# Patient Record
Sex: Male | Born: 1970 | Race: White | Hispanic: No | Marital: Married | Smoking: Current every day smoker
Health system: Southern US, Community
[De-identification: ages and names within clinical notes are randomized; demographics above are authoritative.]

## PROBLEM LIST (undated history)

## (undated) DIAGNOSIS — J984 Other disorders of lung: Secondary | ICD-10-CM

## (undated) DIAGNOSIS — I10 Essential (primary) hypertension: Secondary | ICD-10-CM

## (undated) DIAGNOSIS — J189 Pneumonia, unspecified organism: Secondary | ICD-10-CM

## (undated) DIAGNOSIS — E785 Hyperlipidemia, unspecified: Secondary | ICD-10-CM

## (undated) DIAGNOSIS — M069 Rheumatoid arthritis, unspecified: Secondary | ICD-10-CM

## (undated) DIAGNOSIS — G8929 Other chronic pain: Secondary | ICD-10-CM

## (undated) HISTORY — PX: LUMBAR NERVE STIMLATOR INSERTION: SHX1983

## (undated) HISTORY — DX: Pneumonia, unspecified organism: J18.9

## (undated) HISTORY — DX: Rheumatoid arthritis, unspecified: M06.9

## (undated) HISTORY — DX: Essential (primary) hypertension: I10

## (undated) HISTORY — DX: Hyperlipidemia, unspecified: E78.5

## (undated) HISTORY — DX: Other disorders of lung: J98.4

## (undated) HISTORY — PX: LAMINECTOMY: SHX219

## (undated) HISTORY — DX: Other chronic pain: G89.29

---

## 2013-12-22 ENCOUNTER — Encounter: Payer: Self-pay | Admitting: Critical Care Medicine

## 2013-12-25 ENCOUNTER — Institutional Professional Consult (permissible substitution): Payer: Self-pay | Admitting: Critical Care Medicine

## 2013-12-27 ENCOUNTER — Ambulatory Visit (INDEPENDENT_AMBULATORY_CARE_PROVIDER_SITE_OTHER): Payer: Medicare Other | Admitting: Critical Care Medicine

## 2013-12-27 ENCOUNTER — Ambulatory Visit (INDEPENDENT_AMBULATORY_CARE_PROVIDER_SITE_OTHER)
Admission: RE | Admit: 2013-12-27 | Discharge: 2013-12-27 | Disposition: A | Discharge status: Home / Self Care | Payer: Medicare Other | Source: Ambulatory Visit | Attending: Critical Care Medicine | Admitting: Critical Care Medicine

## 2013-12-27 ENCOUNTER — Encounter: Payer: Self-pay | Admitting: Critical Care Medicine

## 2013-12-27 VITALS — BP 116/84 | HR 107 | Ht 66.0 in | Wt 162.0 lb

## 2013-12-27 DIAGNOSIS — M069 Rheumatoid arthritis, unspecified: Secondary | ICD-10-CM

## 2013-12-27 DIAGNOSIS — E785 Hyperlipidemia, unspecified: Secondary | ICD-10-CM | POA: Insufficient documentation

## 2013-12-27 DIAGNOSIS — G8929 Other chronic pain: Secondary | ICD-10-CM

## 2013-12-27 DIAGNOSIS — J984 Other disorders of lung: Secondary | ICD-10-CM

## 2013-12-27 DIAGNOSIS — J9610 Chronic respiratory failure, unspecified whether with hypoxia or hypercapnia: Secondary | ICD-10-CM

## 2013-12-27 DIAGNOSIS — J438 Other emphysema: Secondary | ICD-10-CM

## 2013-12-27 DIAGNOSIS — J439 Emphysema, unspecified: Secondary | ICD-10-CM | POA: Insufficient documentation

## 2013-12-27 DIAGNOSIS — I10 Essential (primary) hypertension: Secondary | ICD-10-CM | POA: Insufficient documentation

## 2013-12-27 DIAGNOSIS — F17200 Nicotine dependence, unspecified, uncomplicated: Secondary | ICD-10-CM

## 2013-12-27 DIAGNOSIS — J9612 Chronic respiratory failure with hypercapnia: Secondary | ICD-10-CM | POA: Insufficient documentation

## 2013-12-27 MED ORDER — BUPROPION HCL ER (SR) 150 MG PO TB12: 150.0000 mg | ORAL_TABLET | Freq: Two times a day (BID) | ORAL | Status: DC

## 2013-12-27 MED ORDER — TIOTROPIUM BROMIDE MONOHYDRATE 2.5 MCG/ACT IN AERS: INHALATION_SPRAY | RESPIRATORY_TRACT | Status: DC

## 2013-12-27 NOTE — Patient Instructions (Signed)
Chest xray today Start Spiriva two puff daily Stay on oxygen, a face mask was ordered Obtain an ABG today at [facility] Cardiopulmonary Dept Stop smoking, use Wellbutrin 150mg  TWICE daily Return 1 month

## 2013-12-27 NOTE — Progress Notes (Signed)
Subjective:    Patient ID: Willie Cruz, male    DOB: Mar 16, 1971, 43 y.o.   MRN: [id number]  HPI Comments: Dx Copd 04/2013.  Hx of smoking in past. Pt was found AMS, in home.  Pt went to hosp in for one week. Started on oxygen and dx. Then another episode 07/13/13>>another resp failure event. Pt on vent for 4days.  ?oversedation? Pulled out nasal cannula.  Hose got kinked in the car and pt blue then in another hosp in [place]. Two more episodes since that time when fall asleep.    Pt still smokes:  1Pack q 2-3 .  Only tried cold [place]. Tried patches, welbutrin.   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 3L 24/7 .  No pfts yet  Shortness of Breath This is a chronic problem. The current episode started more than 1 month ago. The problem occurs constantly. The problem has been gradually worsening. Associated symptoms include orthopnea, a rash and vomiting. Pertinent negatives include no abdominal pain, chest pain, claudication, coryza, ear pain, fever, headaches, hemoptysis, leg pain, leg swelling, neck pain, PND, rhinorrhea, sore throat, sputum production, swollen glands, syncope or wheezing. The symptoms are aggravated by lying flat and any activity (occ heartburn). Associated symptoms comments: co2 elevated.  . Risk factors include smoking. He has tried nothing for the symptoms. His past medical history is significant for COPD and pneumonia. There is no history of asthma, bronchiolitis, DVT or PE.   Past Medical History  Diagnosis Date  . Rheumatoid arthritis(714.0)   . Hyperlipidemia   . Pneumonia   . Chronic lung disease   . Chronic pain   . Hypertension      Family History  Problem Relation Age of Onset  . Pancreatic cancer Father   . Hypertension Father   . CAD Mother   . Hypertension Brother   . Breast cancer Maternal Aunt      History   Social History  . Marital Status: Married    Spouse Name: N/A    Number of Children: N/A  . Years of Education: N/A   Occupational  History  . Not on file.   Social History Main Topics  . Smoking status: Current Every Day Smoker -- 0.40 packs/day    Types: Cigarettes    Start date: 07/20/1988  . Smokeless tobacco: Never Used  . Alcohol Use: No     Comment: quit in 2000  . Drug Use: No  . Sexual Activity: Not on file   Other Topics Concern  . Not on file   Social History Narrative  . No narrative on file     No Known Allergies   Outpatient Prescriptions Prior to Visit  Medication Sig Dispense Refill  . diazepam (VALIUM) 10 MG tablet Take 10 mg by mouth at bedtime.      . fentaNYL (DURAGESIC - DOSED MCG/HR) 100 MCG/HR Place 100 mcg onto the skin every other day.      . oxyCODONE-acetaminophen (PERCOCET) 10-325 MG per tablet Take 1 tablet by mouth 4 (four) times daily.      . Potassium Chloride ER 20 MEQ TBCR Take by mouth daily.      . pregabalin (LYRICA) 100 MG capsule Take 100 mg by mouth 3 (three) times daily.      . folic acid (FOLVITE) 1 MG tablet ON HOLD      . hydroxychloroquine (PLAQUENIL) 200 MG tablet ON HOLD      . Vitamin D, Ergocalciferol, (DRISDOL) 50000 UNITS CAPS capsule  ON HOLD      . promethazine (PHENERGAN) 25 MG suppository Place 25 mg rectally every 6 (six) hours as needed for nausea or vomiting.       No facility-administered medications prior to visit.       Review of Systems  Constitutional: Positive for fatigue. Negative for fever, chills, diaphoresis, activity change, appetite change and unexpected weight change.  HENT: Positive for dental problem and trouble swallowing. Negative for congestion, ear discharge, ear pain, facial swelling, hearing loss, mouth sores, nosebleeds, postnasal drip, rhinorrhea, sinus pressure, sneezing, sore throat, tinnitus and voice change.   Eyes: Negative for photophobia, discharge, itching and visual disturbance.  Respiratory: Positive for shortness of breath. Negative for apnea, cough, hemoptysis, sputum production, choking, chest tightness,  wheezing and stridor.   Cardiovascular: Positive for orthopnea. Negative for chest pain, palpitations, claudication, leg swelling, syncope and PND.  Gastrointestinal: Positive for nausea, vomiting and abdominal distention. Negative for abdominal pain, constipation and blood in stool.  Genitourinary: Negative for dysuria, urgency, frequency, hematuria, flank pain, decreased urine volume and difficulty urinating.  Musculoskeletal: Positive for back pain, gait problem and joint swelling. Negative for arthralgias, myalgias, neck pain and neck stiffness.  Skin: Positive for color change, pallor and rash.  Neurological: Positive for dizziness, syncope, weakness and light-headedness. Negative for tremors, seizures, speech difficulty, numbness and headaches.  Hematological: Negative for adenopathy. Does not bruise/bleed easily.  Psychiatric/Behavioral: Positive for confusion and agitation. Negative for sleep disturbance. The patient is nervous/anxious.        Objective:   Physical Exam Filed Vitals:   12/27/13 1119  BP: 116/84  Pulse: 107  Height: 5\' 6"  (1.676 m)  Weight: 162 lb (73.483 kg)  SpO2: 97%    Gen: Pleasant, well-nourished, in no distress,  normal affect  ENT: No lesions,  mouth clear,  oropharynx clear, no postnasal drip  Neck: No JVD, no TMG, no carotid bruits  Lungs: No use of accessory muscles, no dullness to percussion, distant BS , exp wheeze   Cardiovascular: RRR, heart sounds normal, no murmur or gallops, no peripheral edema  Abdomen: soft and NT, no HSM,  BS normal  Musculoskeletal: No deformities, no cyanosis or clubbing  Neuro: alert, non focal  Skin: Warm, no lesions or rashes  Dg Chest 2 View  12/27/2013   CLINICAL DATA:  Shortness of breath and cough.  Follow-up pneumonia.  EXAM: CHEST  2 VIEW  COMPARISON:  None.  FINDINGS: Trachea is midline. Heart size normal. Biapical pleural parenchymal scarring. Lungs are somewhat low in volume. Difficult to exclude  mild bronchiectasis in the right upper lobe. Lungs are otherwise clear. No pleural fluid.  IMPRESSION: 1. No acute findings. 2. Possible mild bronchiectasis in the right upper lobe. If further evaluation is desired, CT chest without contrast could be performed.   Electronically Signed   By: 02/26/2014 M.D.   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 diazepam (VALIUM) 10 MG tablet Take 10 mg by mouth at  bedtime.  . fentaNYL (DURAGESIC - DOSED MCG/HR) 100 MCG/HR Place 100 mcg onto the skin every other day.  . oxyCODONE-acetaminophen (PERCOCET) 10-325 MG per tablet Take 1 tablet by mouth 4 (four) times daily.  . Potassium Chloride ER 20 MEQ TBCR Take by mouth daily.  . pregabalin (LYRICA) 100 MG capsule Take 100 mg by mouth 3 (three) times daily.  . promethazine (PHENERGAN) 25 MG tablet Take 25 mg by mouth every 4 (four) hours as needed for nausea or vomiting.  Marland Kitchen. buPROPion (WELLBUTRIN SR) 150 MG 12 hr tablet Take 1 tablet (150 mg total) by mouth 2 (two) times daily.  . folic acid (FOLVITE) 1 MG tablet ON HOLD  . hydroxychloroquine (PLAQUENIL) 200 MG tablet ON HOLD  . methotrexate (RHEUMATREX) 2.5 MG tablet ON HOLD  . Tiotropium Bromide Monohydrate (SPIRIVA RESPIMAT) 2.5 MCG/ACT AERS Two puff daily  . Vitamin D, Ergocalciferol, (DRISDOL) 50000 UNITS CAPS capsule ON HOLD  . [DISCONTINUED] promethazine (PHENERGAN) 25 MG suppository Place 25 mg rectally every 6 (six) hours as needed for  nausea or vomiting.

## 2013-12-28 NOTE — Assessment & Plan Note (Signed)
Copd with chronic hypercarbic resp failure Plan Start Spiriva two puff daily Stay on oxygen, a face mask was ordered Obtain an ABG today at [facility] Cardiopulmonary Dept Stop smoking, use Wellbutrin 150mg  TWICE daily Return 1 month

## 2013-12-29 ENCOUNTER — Ambulatory Visit (HOSPITAL_COMMUNITY)
Admission: RE | Admit: 2013-12-29 | Discharge: 2013-12-29 | Disposition: A | Discharge status: Home / Self Care | Payer: Medicare Other | Source: Ambulatory Visit | Attending: Critical Care Medicine | Admitting: Critical Care Medicine

## 2013-12-29 DIAGNOSIS — I10 Essential (primary) hypertension: Secondary | ICD-10-CM | POA: Insufficient documentation

## 2013-12-29 DIAGNOSIS — E785 Hyperlipidemia, unspecified: Secondary | ICD-10-CM | POA: Insufficient documentation

## 2013-12-29 DIAGNOSIS — F17200 Nicotine dependence, unspecified, uncomplicated: Secondary | ICD-10-CM | POA: Insufficient documentation

## 2013-12-29 DIAGNOSIS — M069 Rheumatoid arthritis, unspecified: Secondary | ICD-10-CM | POA: Insufficient documentation

## 2013-12-29 DIAGNOSIS — J438 Other emphysema: Secondary | ICD-10-CM | POA: Insufficient documentation

## 2013-12-29 LAB — BLOOD GAS, ARTERIAL
Acid-Base Excess: 8.4 mmol/L — ABNORMAL HIGH (ref 0.0–2.0)
Bicarbonate: 34.6 mEq/L — ABNORMAL HIGH (ref 20.0–24.0)
O2 Content: 3 L/min
O2 SAT: 97.6 %
Patient temperature: 98.6
TCO2: 30.7 mmol/L (ref 0–100)
pCO2 arterial: 56.3 mmHg — ABNORMAL HIGH (ref 35.0–45.0)
pH, Arterial: 7.405 (ref 7.350–7.450)
pO2, Arterial: 91.5 mmHg (ref 80.0–100.0)

## 2013-12-29 NOTE — Progress Notes (Signed)
Quick Note:  Called, spoke with pt. Informed her of cxr results per Dr. Delford Field. He verbalized understanding and voiced no further questions or concerns at this time. ______

## 2013-12-29 NOTE — Progress Notes (Signed)
Quick Note:  Called, spoke with pt. Informed him of ABG results and recs per Dr. Delford Field. He verbalized understanding and voiced no further questions or concerns at this time. ______

## 2014-01-01 ENCOUNTER — Telehealth: Payer: Self-pay | Admitting: Critical Care Medicine

## 2014-01-01 NOTE — Telephone Encounter (Signed)
LM x 1 for [name] to return call

## 2014-01-02 NOTE — Telephone Encounter (Signed)
[facility] for [employer]

## 2014-01-03 NOTE — Telephone Encounter (Signed)
lmtcb for [name].  

## 2014-01-04 NOTE — Telephone Encounter (Signed)
LMTCB for Willie Cruz

## 2014-01-31 ENCOUNTER — Ambulatory Visit: Payer: Medicare Other | Admitting: Critical Care Medicine

## 2014-06-04 ENCOUNTER — Ambulatory Visit (INDEPENDENT_AMBULATORY_CARE_PROVIDER_SITE_OTHER): Payer: Medicare Other | Admitting: Critical Care Medicine

## 2014-06-04 ENCOUNTER — Encounter: Payer: Self-pay | Admitting: Critical Care Medicine

## 2014-06-04 VITALS — BP 116/70 | HR 101 | Temp 98.7°F | Ht 66.0 in

## 2014-06-04 DIAGNOSIS — F17200 Nicotine dependence, unspecified, uncomplicated: Secondary | ICD-10-CM

## 2014-06-04 DIAGNOSIS — Z720 Tobacco use: Secondary | ICD-10-CM

## 2014-06-04 DIAGNOSIS — Z23 Encounter for immunization: Secondary | ICD-10-CM

## 2014-06-04 DIAGNOSIS — F17210 Nicotine dependence, cigarettes, uncomplicated: Secondary | ICD-10-CM

## 2014-06-04 DIAGNOSIS — J432 Centrilobular emphysema: Secondary | ICD-10-CM

## 2014-06-04 MED ORDER — TIOTROPIUM BROMIDE MONOHYDRATE 2.5 MCG/ACT IN AERS: INHALATION_SPRAY | RESPIRATORY_TRACT | Status: DC

## 2014-06-04 MED ORDER — TIOTROPIUM BROMIDE MONOHYDRATE 2.5 MCG/ACT IN AERS
2.0000 | INHALATION_SPRAY | Freq: Every day | RESPIRATORY_TRACT | Status: DC
Start: 2014-06-04 — End: 2014-12-12

## 2014-06-04 NOTE — Progress Notes (Signed)
Subjective:    Patient ID: Willie Cruz, male    DOB: [birth year], 43 y.o.   MRN: [id number]  HPI Comments: Dx Copd 04/2013.  Hx of smoking in past. Pt was found AMS, in home.  Pt went to hosp in for one week. Started on oxygen and dx. Then another episode 07/13/13>>another resp failure event. Pt on vent for 4days.  ?oversedation? Pulled out nasal cannula.  Hose got kinked in the car and pt blue then in another hosp in [place]. Two more episodes since that time when fall asleep.    Pt still smokes:  1Pack q 2-3 .  Only tried cold [place]. Tried patches, welbutrin.   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 3L 24/7 .  No pfts yet  Shortness of Breath Associated symptoms include a rash and vomiting. Pertinent negatives include no abdominal pain, chest pain, ear pain, fever, headaches, leg swelling, neck pain, rhinorrhea, sore throat, swollen glands or wheezing.   06/04/2014. Chief Complaint  Patient presents with  . Follow-up    SOB unchanged.  PND and clearing throat qhs.  No chest tightness/pain and wheezing.  At last ov Start Spiriva two puff daily Stay on oxygen, a face mask was ordered Obtain an ABG today at [facility] Cardiopulmonary Dept Stop smoking, use Wellbutrin 150mg  TWICE daily  Pt on spiriva only as needed. Not able to afford the med. Pt has cut in 1/2 cig smoking.      Review of Systems  Constitutional: Positive for fatigue. Negative for fever, chills, diaphoresis, activity change, appetite change and unexpected weight change.  HENT: Positive for dental problem and trouble swallowing. Negative for congestion, ear discharge, ear pain, facial swelling, hearing loss, mouth sores, nosebleeds, postnasal drip, rhinorrhea, sinus pressure, sneezing, sore throat, tinnitus and voice change.   Eyes: Negative for photophobia, discharge, itching and visual disturbance.  Respiratory: Positive for shortness of breath. Negative for apnea, cough, choking, chest tightness, wheezing and  stridor.   Cardiovascular: Negative for chest pain, palpitations and leg swelling.  Gastrointestinal: Positive for nausea, vomiting and abdominal distention. Negative for abdominal pain, constipation and blood in stool.  Genitourinary: Negative for dysuria, urgency, frequency, hematuria, flank pain, decreased urine volume and difficulty urinating.  Musculoskeletal: Positive for back pain, joint swelling and gait problem. Negative for myalgias, arthralgias, neck pain and neck stiffness.  Skin: Positive for color change, pallor and rash.  Neurological: Positive for dizziness, syncope, weakness and light-headedness. Negative for tremors, seizures, speech difficulty, numbness and headaches.  Hematological: Negative for adenopathy. Does not bruise/bleed easily.  Psychiatric/Behavioral: Positive for confusion and agitation. Negative for sleep disturbance. The patient is nervous/anxious.        Objective:   Physical Exam Filed Vitals:   06/04/14 1102  BP: 116/70  Pulse: 101  Temp: 98.7 F (37.1 C)  TempSrc: Oral  Height: 5\' 6"  (1.676 m)  SpO2: 95%    Gen: Pleasant, well-nourished, in no distress,  normal affect  ENT: No lesions,  mouth clear,  oropharynx clear, no postnasal drip  Neck: No JVD, no TMG, no carotid bruits  Lungs: No use of accessory muscles, no dullness to percussion, distant BS , exp wheeze   Cardiovascular: RRR, heart sounds normal, no murmur or gallops, no peripheral edema  Abdomen: soft and NT, no HSM,  BS normal  Musculoskeletal: No deformities, no cyanosis or clubbing  Neuro: alert, non focal  Skin: Warm, no lesions or rashes  No results found.    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 baclofen (LIORESAL) 10 MG tablet Take 20 mg by mouth at bedtime.  . cloNIDine (CATAPRES) 0.1 MG tablet Take 0.1 mg by mouth 2 (two) times daily.  . fentaNYL (DURAGESIC - DOSED MCG/HR) 50 MCG/HR Place 50 mcg onto the skin every other day.  . pregabalin (LYRICA) 100 MG capsule Take 100 mg by mouth 3 (three) times daily.  . promethazine (PHENERGAN) 25 MG tablet Take 25 mg by mouth every 4 (four) hours as needed for nausea or vomiting.  . Tiotropium Bromide Monohydrate (SPIRIVA RESPIMAT) 2.5 MCG/ACT AERS Two puff daily  . [DISCONTINUED] Tiotropium Bromide Monohydrate (SPIRIVA RESPIMAT) 2.5 MCG/ACT AERS Two puff daily (Patient taking differently: Two puff daily as needed)  . folic acid (FOLVITE) 1 MG tablet ON HOLD  . hydroxychloroquine (PLAQUENIL) 200 MG tablet ON HOLD  . methotrexate (RHEUMATREX) 2.5 MG tablet ON HOLD  . oxyCODONE-acetaminophen (PERCOCET) 10-325 MG per tablet Take 1 tablet by mouth 4 (four) times daily.  . Potassium Chloride ER 20 MEQ TBCR Take by mouth daily.  . Tiotropium Bromide Monohydrate (SPIRIVA RESPIMAT) 2.5 MCG/ACT AERS Inhale 2 puffs into the lungs daily.  . Vitamin D, Ergocalciferol, (DRISDOL) 50000 UNITS CAPS capsule ON HOLD  . [DISCONTINUED] buPROPion (WELLBUTRIN SR) 150 MG 12 hr tablet Take 1 tablet (150 mg total) by mouth 2 (two) times daily.  . [DISCONTINUED] diazepam (VALIUM) 10 MG tablet Take 10 mg by mouth at bedtime.  . [DISCONTINUED] fentaNYL (DURAGESIC - DOSED MCG/HR) 100 MCG/HR Place 100 mcg onto the skin every other day.

## 2014-06-04 NOTE — Patient Instructions (Signed)
Samples of spiriva given You deferred flu and pneumonia vaccines Return 6 months

## 2014-06-05 ENCOUNTER — Encounter: Payer: Self-pay | Admitting: Critical Care Medicine

## 2014-06-05 NOTE — Assessment & Plan Note (Signed)
3-10 minutes of smoking cessation counseling was issued to the patient  

## 2014-06-05 NOTE — Assessment & Plan Note (Signed)
COPD with primary emphysematous component and ongoing tobacco use Plan The patient was counseled against further smoking Samples of Spiriva were given

## 2014-12-12 ENCOUNTER — Encounter: Payer: Self-pay | Admitting: Critical Care Medicine

## 2014-12-12 ENCOUNTER — Ambulatory Visit (INDEPENDENT_AMBULATORY_CARE_PROVIDER_SITE_OTHER): Payer: Commercial Managed Care - HMO | Admitting: Critical Care Medicine

## 2014-12-12 VITALS — BP 130/84 | HR 109 | Temp 98.0°F | Ht 66.0 in | Wt 155.0 lb

## 2014-12-12 DIAGNOSIS — J9612 Chronic respiratory failure with hypercapnia: Secondary | ICD-10-CM

## 2014-12-12 DIAGNOSIS — F17200 Nicotine dependence, unspecified, uncomplicated: Secondary | ICD-10-CM

## 2014-12-12 DIAGNOSIS — Z720 Tobacco use: Secondary | ICD-10-CM

## 2014-12-12 DIAGNOSIS — J432 Centrilobular emphysema: Secondary | ICD-10-CM | POA: Diagnosis not present

## 2014-12-12 MED ORDER — TIOTROPIUM BROMIDE MONOHYDRATE 2.5 MCG/ACT IN AERS: INHALATION_SPRAY | RESPIRATORY_TRACT | Status: AC

## 2014-12-12 NOTE — Patient Instructions (Signed)
Stay on spiriva Stay on oxygen 3Liters Try to reduce nicotine use  Return 6 months

## 2014-12-12 NOTE — Assessment & Plan Note (Signed)
See emphysematous assessment

## 2014-12-12 NOTE — Assessment & Plan Note (Signed)
Tobacco abuse Rx smoking cessation counseling 5-10 min

## 2014-12-12 NOTE — Assessment & Plan Note (Signed)
Copd gold C with chronic hypoxemia and hypercarbia Ongoing tobacco use Chronic oxygen Rx Not a candidate for bilevel/cpap Plan Cont spiriva Smoking cessation No other changes

## 2014-12-12 NOTE — Progress Notes (Signed)
Subjective:    Patient ID: Willie Cruz, male    DOB: [birth year], 44 y.o.   MRN: [id number]  HPI 12/12/2014 Chief Complaint  Patient presents with  . 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 Pt states    Copd f/u.  Smoker. On spiriva. Last seen 05/2014. Recent change in spinal cord stimulator and new high freq stim:  ?? No real change in outcomes.  High freq causes nausea. Pt in recent hosp stay of copd exac, oxygen came off during night and worsened, AMS>>>pco2 73 pH 7.33     Still smoking 1/2 PPD.   Pt denies any significant sore throat, nasal congestion or excess secretions, fever, chills, sweats, unintended weight loss, pleurtic or exertional chest pain, orthopnea PND, or leg swelling Pt denies any increase in rescue therapy over baseline, denies waking up needing it or having any early am or nocturnal exacerbations of coughing/wheezing/or dyspnea. Pt also denies any obvious fluctuation in symptoms with  weather or environmental change or other alleviating or aggravating factors   Current Medications, Allergies, Complete Past Medical History, Past Surgical History, Family History, and Social History were reviewed in [employer] electronic medical record per todays encounter:  12/12/2014      Review of Systems  Constitutional: Positive for fatigue.  HENT: Negative.  Negative for ear pain, postnasal drip, rhinorrhea, sinus pressure, sore throat, trouble swallowing and voice change.   Eyes: Negative.   Respiratory: Positive for cough and shortness of breath. Negative for apnea, choking, chest tightness, wheezing and stridor.   Cardiovascular: Negative.  Negative for chest pain, palpitations and leg swelling.  Gastrointestinal: Negative.  Negative for nausea, vomiting, abdominal pain and abdominal distention.  Genitourinary: Negative.   Musculoskeletal: Negative.  Negative for myalgias and arthralgias.  Skin: Negative.  Negative for rash.    Allergic/Immunologic: Negative.  Negative for environmental allergies and food allergies.  Neurological: Negative.  Negative for dizziness, syncope, weakness and headaches.  Hematological: Negative.  Negative for adenopathy. Does not bruise/bleed easily.  Psychiatric/Behavioral: Negative.  Negative for sleep disturbance and agitation. The patient is not nervous/anxious.        Objective:   Physical Exam Filed Vitals:   12/12/14 1050  BP: 130/84  Pulse: 109  Temp: 98 F (36.7 C)  Height: 5\' 6"  (1.676 m)  Weight: 155 lb (70.308 kg)  SpO2: 97%    Gen: Pleasant, well-nourished, in no distress,  normal affect  ENT: No lesions,  mouth clear,  oropharynx clear, no postnasal drip  Neck: No JVD, no TMG, no carotid bruits  Lungs: No use of accessory muscles, no dullness to percussion, distant BS   Cardiovascular: RRR, heart sounds normal, no murmur or gallops, no peripheral edema  Abdomen: soft and NT, no HSM,  BS normal  Musculoskeletal: No deformities, no cyanosis or clubbing  Neuro: alert, paraplegic  Skin: Warm, no lesions or rashes  No results found.        Assessment & Plan:  I personally reviewed all images and lab data in the [facility] system as well as any outside material available during this office visit and agree with the  radiology impressions.   COPD with emphysema Copd gold C with chronic hypoxemia and hypercarbia Ongoing tobacco use Chronic oxygen Rx Not a candidate for bilevel/cpap Plan Cont spiriva Smoking cessation No other changes   Chronic respiratory failure with hypercapnia See emphysematous assessment    Tobacco use disorder Tobacco abuse Rx smoking cessation counseling 5-10 min  Juvencio was seen today for follow-up.  Diagnoses and all orders for this visit:  Centrilobular emphysema  Chronic respiratory failure with hypercapnia  Tobacco use disorder  Other orders -     Tiotropium Bromide Monohydrate (SPIRIVA RESPIMAT) 2.5  MCG/ACT AERS; Two puff daily    I had an extended discussion with the patient and or family lasting 10 minutes of a 25 minute visit including:  Regarding smoking cessation

## 2015-04-13 IMAGING — CR DG CHEST 2V
2 series · 2 of 2 positions shown · non-contrast
Comparison: None.

CLINICAL DATA: Shortness of breath and cough.  Follow-up pneumonia.

EXAM:
CHEST  2 VIEW

[view not recorded (1 of 2)]
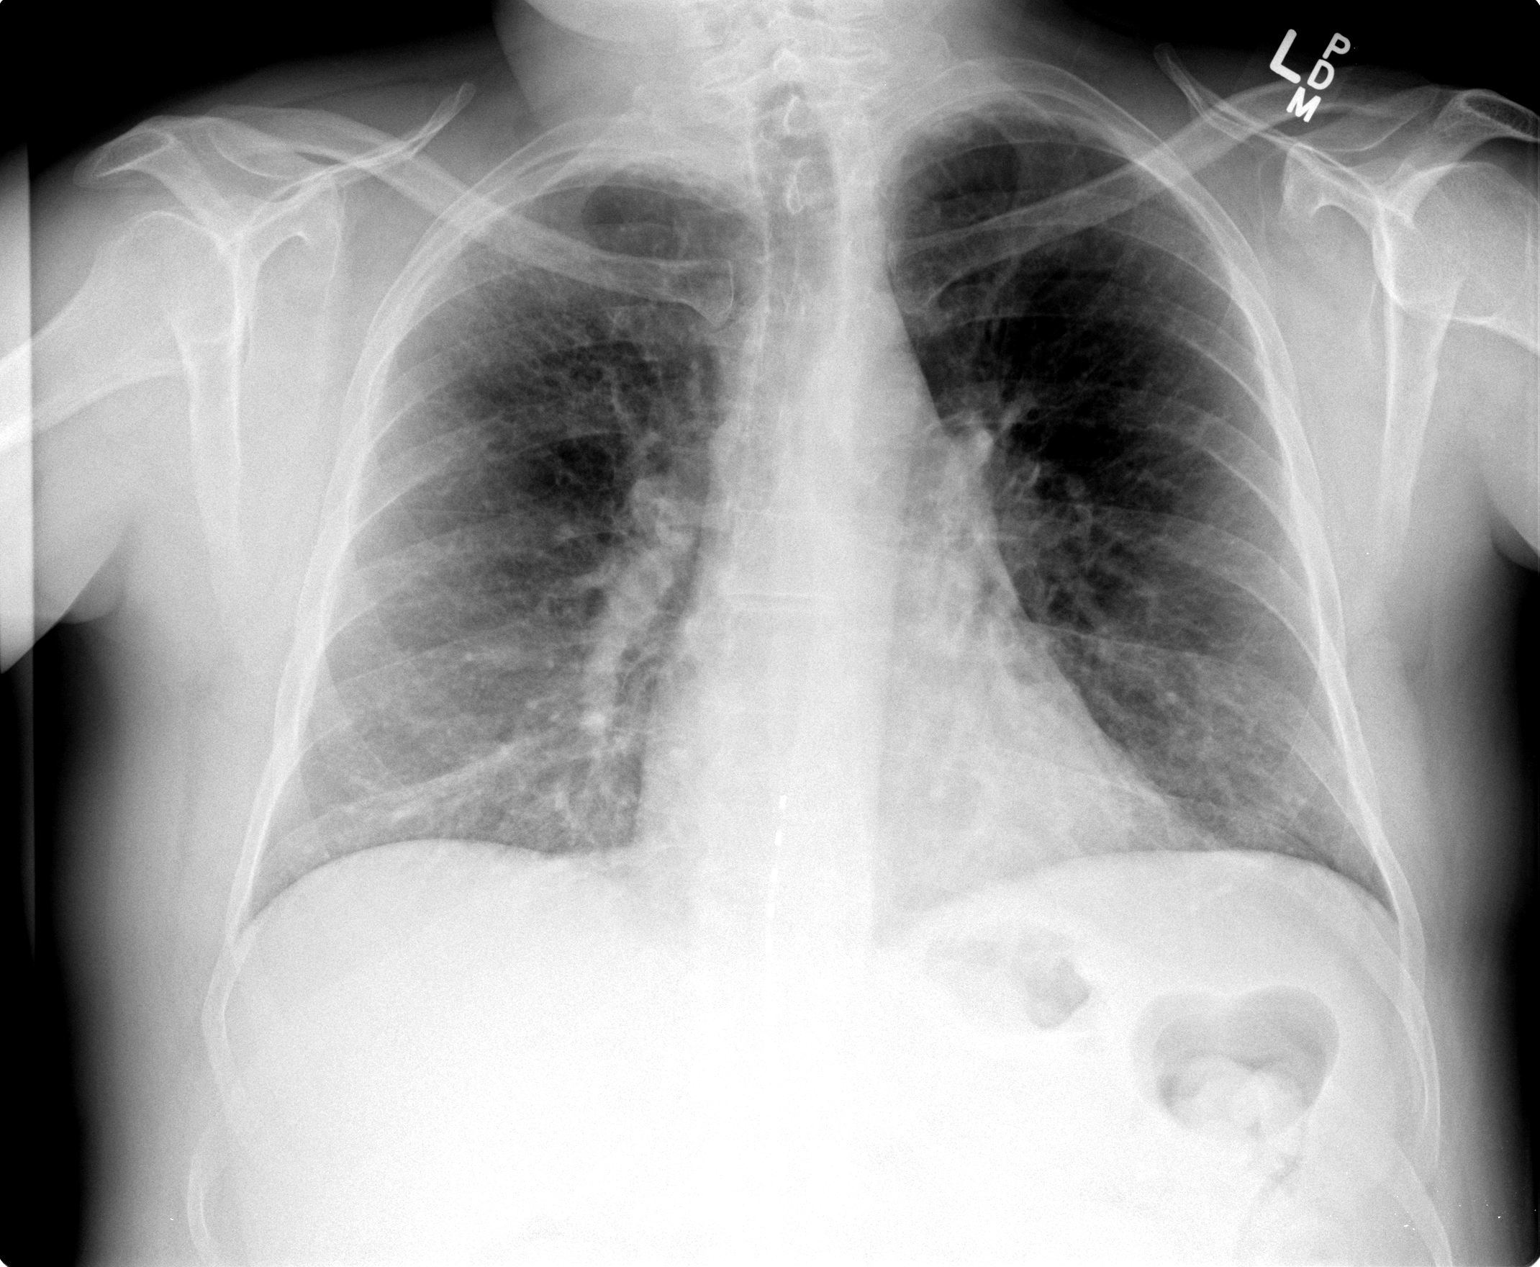

[view not recorded (2 of 2)]
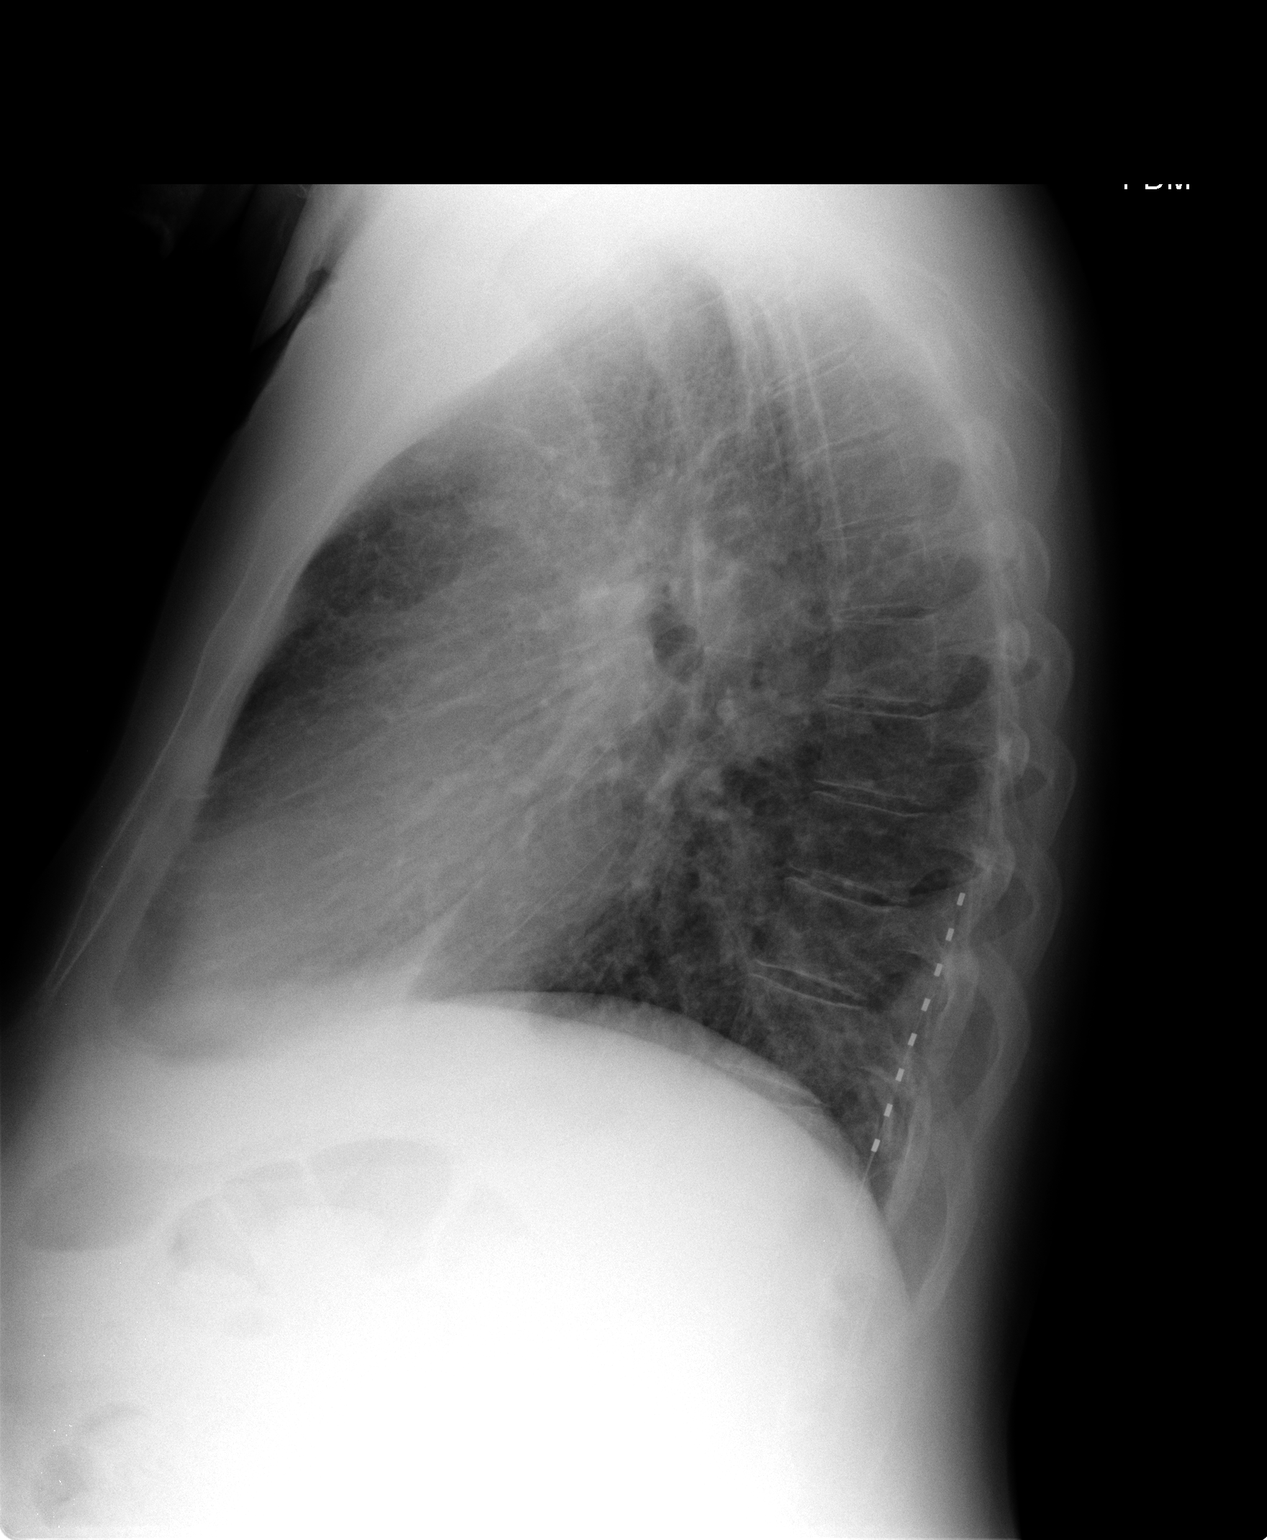

[2 of 2 positions shown; findings below may reference images not displayed]

FINDINGS: Trachea is midline. Heart size normal. Biapical pleural parenchymal
scarring. Lungs are somewhat low in volume. Difficult to exclude
mild bronchiectasis in the right upper lobe. Lungs are otherwise
clear. No pleural fluid.
IMPRESSION: 1. No acute findings.
2. Possible mild bronchiectasis in the right upper lobe. If further
evaluation is desired, CT chest without contrast could be performed.

## 2015-06-18 ENCOUNTER — Ambulatory Visit: Payer: Commercial Managed Care - HMO | Admitting: Pulmonary Disease

## 2015-07-03 ENCOUNTER — Encounter: Payer: Self-pay | Admitting: Pulmonary Disease

## 2015-07-03 ENCOUNTER — Ambulatory Visit (INDEPENDENT_AMBULATORY_CARE_PROVIDER_SITE_OTHER): Payer: Medicare Other | Admitting: Pulmonary Disease

## 2015-07-03 VITALS — BP 110/70 | HR 113 | Ht 66.0 in | Wt 160.0 lb

## 2015-07-03 DIAGNOSIS — J432 Centrilobular emphysema: Secondary | ICD-10-CM | POA: Diagnosis not present

## 2015-07-03 DIAGNOSIS — J9612 Chronic respiratory failure with hypercapnia: Secondary | ICD-10-CM

## 2015-07-03 NOTE — Patient Instructions (Signed)
We will schedule you for lung function tests.  Return to clinic in 6 months.

## 2015-07-03 NOTE — Progress Notes (Signed)
Subjective:    Patient ID: Willie Cruz, male    DOB: January 20, 1971, 44 y.o.   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  HPI  Follow-up for COPD.  Willie Cruz is a 44 year old with history of COPD. He is a former patient of Dr. Delford Field. He has history of chronic hypoxemia and hypercarbia. He was hospitalized in the past for hypercarbic, hypoxemic respiratory failure. Dr. Delford Field felt that he is not a candidate for BiPAP as an outpatient. He's had several hospitalizations in the past where the nasal cannula was displaced and he became unresponsive. He is maintained on Spiriva. He feels that his symptoms are at baseline with no exacerbations or ER visits.  He is a heavy smoker with 30-pack-year smoking history. He is trying to cut down and is now down to half pack for every 2 days.  Data: Spirometry 12/27/13 FVC 3.23 [7%] FEV1 2.63 [70%] F/F 82  ABG 12/29/13 7.4/56/91/35/98%  Past Medical History  Diagnosis Date  . Rheumatoid arthritis(714.0)   . Hyperlipidemia   . Pneumonia   . Chronic lung disease   . Chronic pain   . Hypertension      Current outpatient prescriptions:  .  baclofen (LIORESAL) 10 MG tablet, Take 20 mg by mouth at bedtime., Disp: , Rfl:  .  folic acid (FOLVITE) 1 MG tablet, ON HOLD, Disp: , Rfl:  .  furosemide (LASIX) 20 MG tablet, Take 20 mg by mouth as needed., Disp: , Rfl:  .  hydroxychloroquine (PLAQUENIL) 200 MG tablet, Take 400 mg by mouth 2 (two) times daily. ON HOLD, Disp: , Rfl:  .  oxyCODONE-acetaminophen (PERCOCET) 10-325 MG per tablet, Take 1 tablet by mouth 3 (three) times daily. , Disp: , Rfl:  .  Potassium Chloride ER 20 MEQ TBCR, Take 20 mEq by mouth as needed., Disp: , Rfl:  .  pregabalin (LYRICA) 100 MG capsule, Take 100 mg by mouth 3 (three) times daily., Disp: , Rfl:  .  promethazine (PHENERGAN) 25 MG tablet, Take 25 mg by mouth every 4 (four) hours as needed for nausea or vomiting., Disp: , Rfl:  .   rOPINIRole (REQUIP) 1 MG tablet, Take 1 mg by mouth 2 (two) times daily., Disp: , Rfl:  .  Tiotropium Bromide Monohydrate (SPIRIVA RESPIMAT) 2.5 MCG/ACT AERS, Two puff daily, Disp: 4 g, Rfl: 11 .  amitriptyline (ELAVIL) 25 MG tablet, Take 25 mg by mouth at bedtime., Disp: , Rfl:  .  cloNIDine (CATAPRES) 0.1 MG tablet, Take 0.1 mg by mouth 2 (two) times daily. Reported on 07/03/2015, Disp: , Rfl:  .  DULoxetine (CYMBALTA) 30 MG capsule, Take 90 mg by mouth 3 (three) times daily., Disp: , Rfl:  .  Vitamin D, Ergocalciferol, (DRISDOL) 50000 UNITS CAPS capsule, Reported on 07/03/2015, Disp: , Rfl:   Review of Systems  Dyspnea on exertion, no cough, sputum, wheezing, hemoptysis. No chest pain, palpitations. No nausea, vomiting, diarrhea, constipation. No fevers, chills, malaise, fatigue. All other review of systems are negative     Objective:   Physical Exam Blood pressure 110/70, pulse 113, height 5\' 6"  (1.676 m), weight 160 lb (72.576 kg), SpO2 98 %.  Gen: No apparent distress Neuro: No gross focal deficits. Neck: No JVD, lymphadenopathy, thyromegaly. RS: Clear, no wheeze, crackles. CVS: S1-S2 heard, no murmurs rubs gallops. Abdomen: Soft, positive bowel sounds. Extremities: No edema.    Assessment & Plan:  Follow-up for COPD.  Doing well on Spiriva and supplemental  O2 with no exacerbations or ER visits. He will continue the same without any change. I will get a set of lung function test with a more recent assessment of his pulmonary function. He refused to get a flu shot today.  Plan: - Continue Spiriva and supplemental oxygen. - Pulmonary function test. - Refused flu vaccination.  Chilton Greathouse MD [facility] Pager [phone] If no answer or after 3pm call: [phone] 07/03/2015, 3:28 PM

## 2017-02-17 DEATH — deceased
